# Patient Record
Sex: Male | Born: 1980 | Race: White | Hispanic: No | Marital: Married | Smoking: Never smoker
Health system: Southern US, Community
[De-identification: ages and names within clinical notes are randomized; demographics above are authoritative.]

---

## 2014-09-27 ENCOUNTER — Other Ambulatory Visit: Payer: Self-pay | Admitting: Adult Health

## 2014-09-27 ENCOUNTER — Ambulatory Visit (INDEPENDENT_AMBULATORY_CARE_PROVIDER_SITE_OTHER): Payer: Self-pay

## 2014-09-27 DIAGNOSIS — S6992XA Unspecified injury of left wrist, hand and finger(s), initial encounter: Secondary | ICD-10-CM

## 2014-09-27 DIAGNOSIS — S62639A Displaced fracture of distal phalanx of unspecified finger, initial encounter for closed fracture: Secondary | ICD-10-CM

## 2014-09-30 ENCOUNTER — Encounter: Payer: Self-pay | Admitting: Sports Medicine

## 2014-09-30 ENCOUNTER — Ambulatory Visit (INDEPENDENT_AMBULATORY_CARE_PROVIDER_SITE_OTHER): Payer: Worker's Compensation | Admitting: Sports Medicine

## 2014-09-30 VITALS — BP 134/81 | HR 81 | Ht 71.0 in | Wt 235.0 lb

## 2014-09-30 DIAGNOSIS — M20012 Mallet finger of left finger(s): Secondary | ICD-10-CM

## 2014-09-30 DIAGNOSIS — IMO0001 Reserved for inherently not codable concepts without codable children: Secondary | ICD-10-CM | POA: Insufficient documentation

## 2014-09-30 NOTE — Progress Notes (Signed)
   Subjective:    I'm seeing this patient as a consultation for:  Laurance FlattenKaty Bess, NP  CC:  "broken finger"  HPI:  Patient presents with c/o inability to extend distal interphalangeal joint since he jammed his finger against equipment at work 4 days ago. After the injury the patient did not exhibit pain or swelling, but was unable to move his finger. On the day of injury he presented to occupational health where an XR left Hand showed an avulsion fracture along the proximal most aspect of the third distal phalanx dorsally. He has been wearing a malleable finger splint to keep the phalang in extension since that time. He has not had much pain but takes ibuprofen when he needs it.   Past medical history, Surgical history, Family history not pertinant except as noted below, Social history, Allergies, and medications have been entered into the medical record, reviewed, and no changes needed.   Review of Systems: No headache, visual changes, nausea, vomiting, diarrhea, constipation, dizziness, abdominal pain, skin rash, fevers, chills, night sweats, weight loss, swollen lymph nodes, body aches, joint swelling, muscle aches, chest pain, shortness of breath, mood changes, visual or auditory hallucinations.   Objective:   General: Well Developed, well nourished, and in no acute distress.  Neuro/Psych: Alert and oriented x3, extra-ocular muscles intact, able to move all 4 extremities, sensation grossly intact. Skin: Warm and dry, no rashes noted.  Respiratory: Not using accessory muscles, speaking in full sentences, trachea midline.  Cardiovascular: Pulses palpable, no extremity edema. Abdomen: Does not appear distended. MSK: Left third DIP mildly tender to palpation, there is mild ecchymoses present around the joint. Sensation to fine touch is intact. ROM testing of the joint deferred so as not interrupt healing.    Impression and Recommendations:   This case required medical decision making of moderate  complexity.  # Distal Interphalangeal Joint Avulsion Fracture - Patient's symptoms and imaging consistent with complete osseous avulsion. - Phalange placed in 5.5 size Staxx splint to maintain extension. Plan to continue splinting for 8 weeks. 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 - Patient may continue ibuprofen as needed for pain.   Follow up in 4 weeks or sooner as needed

## 2014-09-30 NOTE — Assessment & Plan Note (Signed)
There is a bony mallet deformity. We are going to do extension splinting for 8 weeks. Return in 4 weeks, for a repeat x-ray.  I billed a fracture code for this encounter, all subsequent visits will be post-op checks in the global period.

## 2014-10-05 ENCOUNTER — Institutional Professional Consult (permissible substitution): Payer: Self-pay | Admitting: Sports Medicine

## 2014-10-28 ENCOUNTER — Encounter: Payer: Self-pay | Admitting: Sports Medicine

## 2014-10-28 ENCOUNTER — Ambulatory Visit (INDEPENDENT_AMBULATORY_CARE_PROVIDER_SITE_OTHER): Payer: Worker's Compensation

## 2014-10-28 ENCOUNTER — Ambulatory Visit (INDEPENDENT_AMBULATORY_CARE_PROVIDER_SITE_OTHER): Payer: Worker's Compensation | Admitting: Sports Medicine

## 2014-10-28 VITALS — BP 125/80 | HR 84 | Ht 71.0 in | Wt 231.0 lb

## 2014-10-28 DIAGNOSIS — M20012 Mallet finger of left finger(s): Secondary | ICD-10-CM

## 2014-10-28 DIAGNOSIS — IMO0001 Reserved for inherently not codable concepts without codable children: Secondary | ICD-10-CM

## 2014-10-28 NOTE — Progress Notes (Signed)
  Subjective:  This pleasant 34 year old male is 4 weeks post bony mallet deformity of the left third finger, doing extremely well in a extension splint.  Objective: General: Well-developed, well-nourished, and in no acute distress. Left third finger: Splint is removed and he is able to maintain active and full extension with resistance. No tenderness to palpation of the dorsum of the distal interphalangeal joint.  X-rays reviewed and show healing of the bony mallet deformity.  Assessment/plan:

## 2014-10-28 NOTE — Assessment & Plan Note (Signed)
Overall doing extremely well, able to maintain full extension both passive and active. X-rays look good, there is evidence of healing. I'm going to have him return to full duty however he will be extremely careful to continue his extension splint, and avoid any trauma to the finger. Return in one month, we will likely discontinue the extension splint and start working on range of motion.

## 2014-11-25 ENCOUNTER — Encounter: Payer: Self-pay | Admitting: Sports Medicine

## 2014-11-25 ENCOUNTER — Ambulatory Visit (INDEPENDENT_AMBULATORY_CARE_PROVIDER_SITE_OTHER): Payer: Worker's Compensation | Admitting: Sports Medicine

## 2014-11-25 VITALS — BP 124/78 | HR 69 | Ht 71.0 in | Wt 232.0 lb

## 2014-11-25 DIAGNOSIS — IMO0001 Reserved for inherently not codable concepts without codable children: Secondary | ICD-10-CM

## 2014-11-25 DIAGNOSIS — M20012 Mallet finger of left finger(s): Secondary | ICD-10-CM

## 2014-11-25 NOTE — Progress Notes (Signed)
  Subjective:  This pleasant 34 year old male is 8 weeks post bony mallet deformity of the left third finger, doing extremely well in a extension splint.  Objective: General: Well-developed, well-nourished, and in no acute distress. Left third finger: Splint is removed and he is able to maintain active and full extension with resistance. No tenderness to palpation of the dorsum of the distal interphalangeal joint. Again, able to maintain full extension with resistance. He does have some degree of flexion but is limited as expected.  Assessment/plan:

## 2014-11-25 NOTE — Assessment & Plan Note (Signed)
Doing extremely well after bony mallet fracture, with full extension, and healing on the last x-ray. At this point we will discontinue the extension splint, and work on gentle range of motion. Return to see me in 4 weeks for a final recheck before discharge.

## 2014-12-23 ENCOUNTER — Ambulatory Visit: Payer: Self-pay | Admitting: Sports Medicine

## 2014-12-24 ENCOUNTER — Ambulatory Visit (INDEPENDENT_AMBULATORY_CARE_PROVIDER_SITE_OTHER): Payer: Worker's Compensation | Admitting: Sports Medicine

## 2014-12-24 ENCOUNTER — Encounter: Payer: Self-pay | Admitting: Sports Medicine

## 2014-12-24 VITALS — BP 123/74 | HR 98 | Ht 71.0 in | Wt 241.0 lb

## 2014-12-24 DIAGNOSIS — M20012 Mallet finger of left finger(s): Secondary | ICD-10-CM | POA: Diagnosis not present

## 2014-12-24 DIAGNOSIS — IMO0001 Reserved for inherently not codable concepts without codable children: Secondary | ICD-10-CM

## 2014-12-24 NOTE — Assessment & Plan Note (Signed)
Still has approximately 2-3 of extension lag but excellent strength to flexion and extension. He is functional at work and happy with the results. He can return as needed.

## 2014-12-24 NOTE — Progress Notes (Signed)
  Subjective:    CC: Follow-up mallet finger  HPI: Seth Morrow returns, I saw him with a mallet deformity of his left third finger, he was in an extension splint for 8 weeks, and returned with excellent resolution of symptoms, and full strength. Today he comes back for a final recheck, he still has approximately 2-3 of extension lag but excellent strength to extension and flexion at the distal interphalangeal joint. He is happy with results so far, functional at work, and does not desire any further visits.  Past medical history, Surgical history, Family history not pertinant except as noted below, Social history, Allergies, and medications have been entered into the medical record, reviewed, and no changes needed.   Review of Systems: No fevers, chills, night sweats, weight loss, chest pain, or shortness of breath.   Objective:    General: Well Developed, well nourished, and in no acute distress.  Neuro: Alert and oriented x3, extra-ocular muscles intact, sensation grossly intact.  HEENT: Normocephalic, atraumatic, pupils equal round reactive to light, neck supple, no masses, no lymphadenopathy, thyroid nonpalpable.  Skin: Warm and dry, no rashes. Cardiac: Regular rate and rhythm, no murmurs rubs or gallops, no lower extremity edema.  Respiratory: Clear to auscultation bilaterally. Not using accessory muscles, speaking in full sentences. Left hand: Middle finger with approximately 2-3 of extension lag at the DIP however excellent strength to resisted extension and flexion with stable collaterals and neurovascularly intact distally.  Impression and Recommendations:

## 2015-05-26 IMAGING — DX DG HAND COMPLETE 3+V*L*
3 series · 3 of 3 positions shown · non-contrast
Comparison: None.

CLINICAL DATA: Jamming injury left third DIP joint are earlier
today

EXAM:
LEFT HAND - COMPLETE 3+ VIEW

[hand ap]
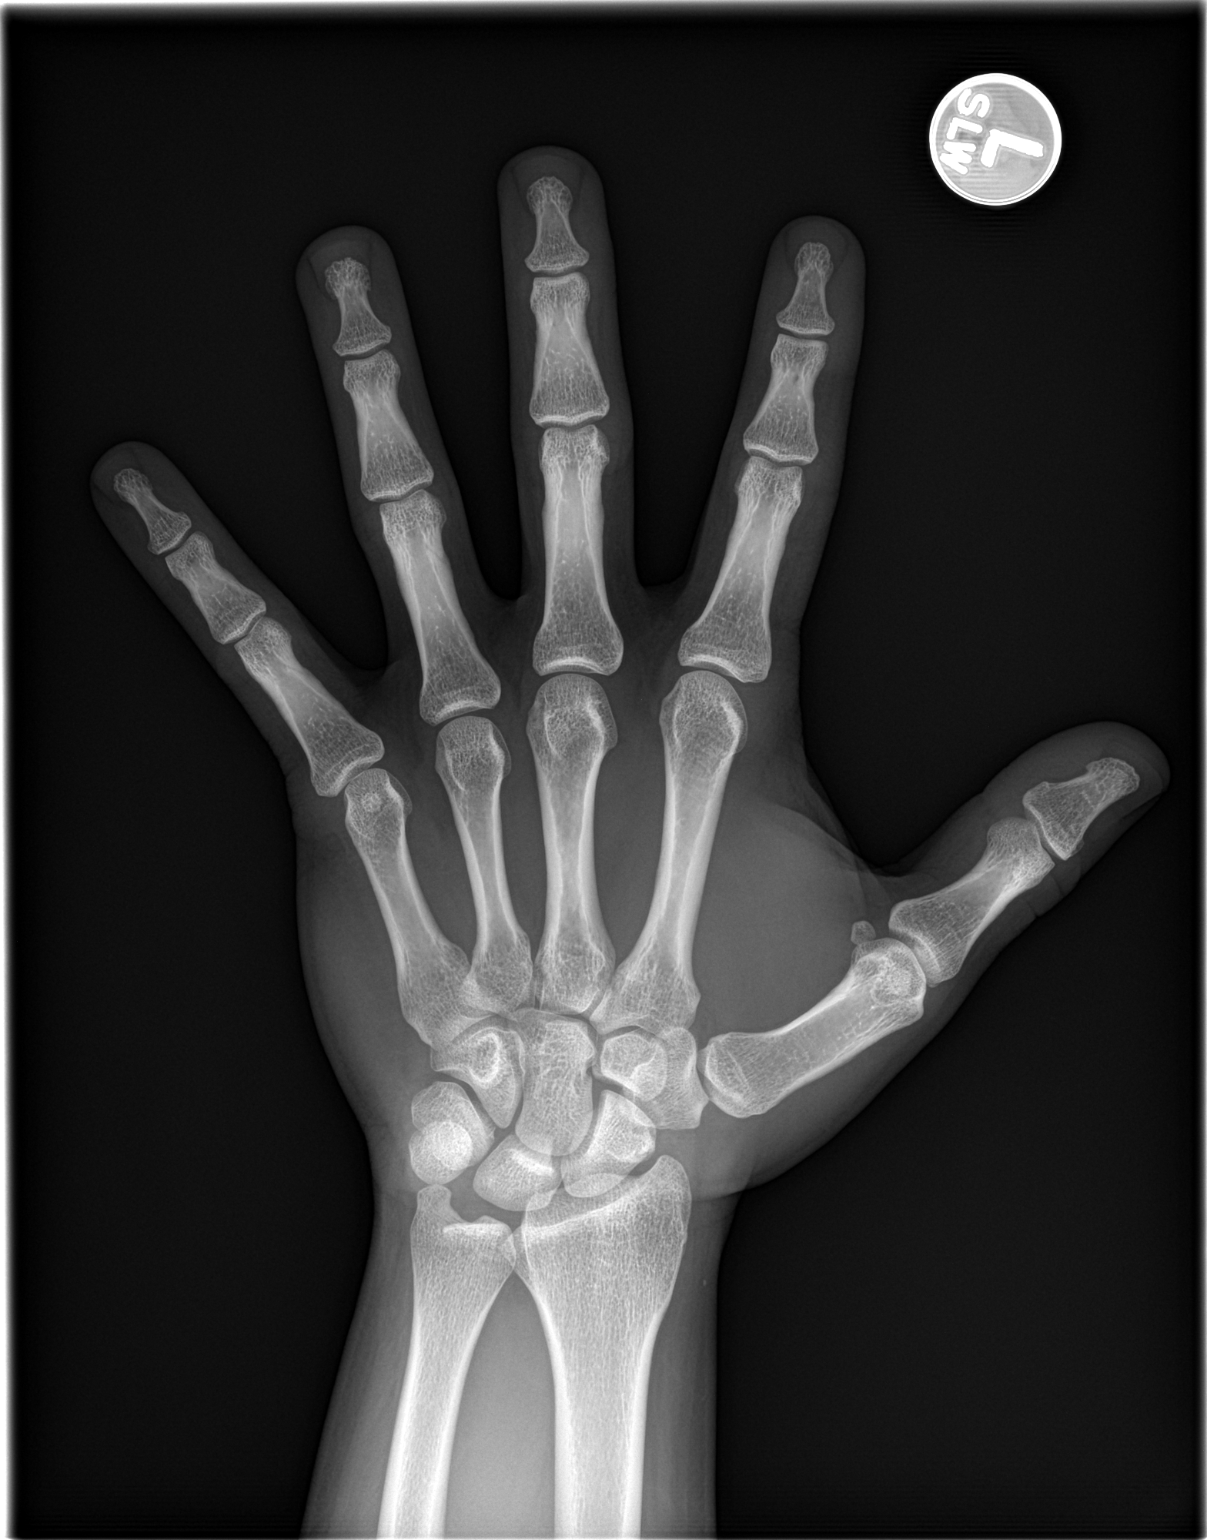

[hand obl]
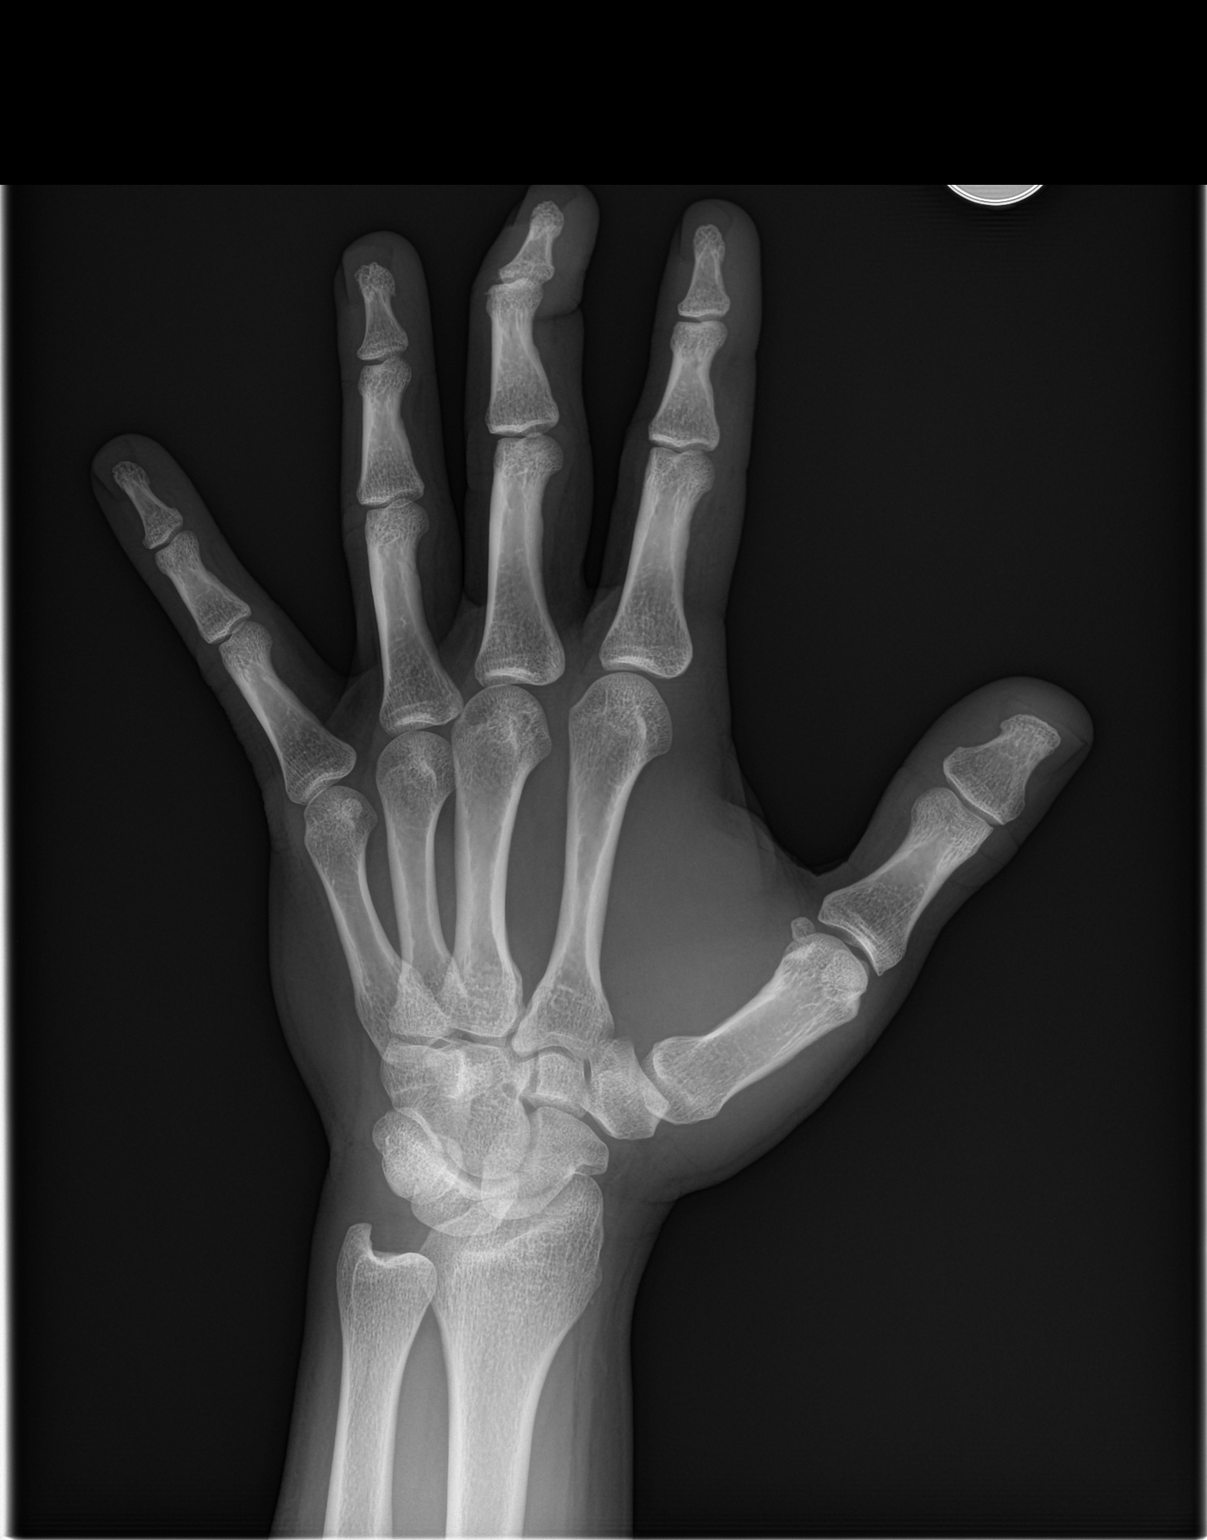

[hand lat]
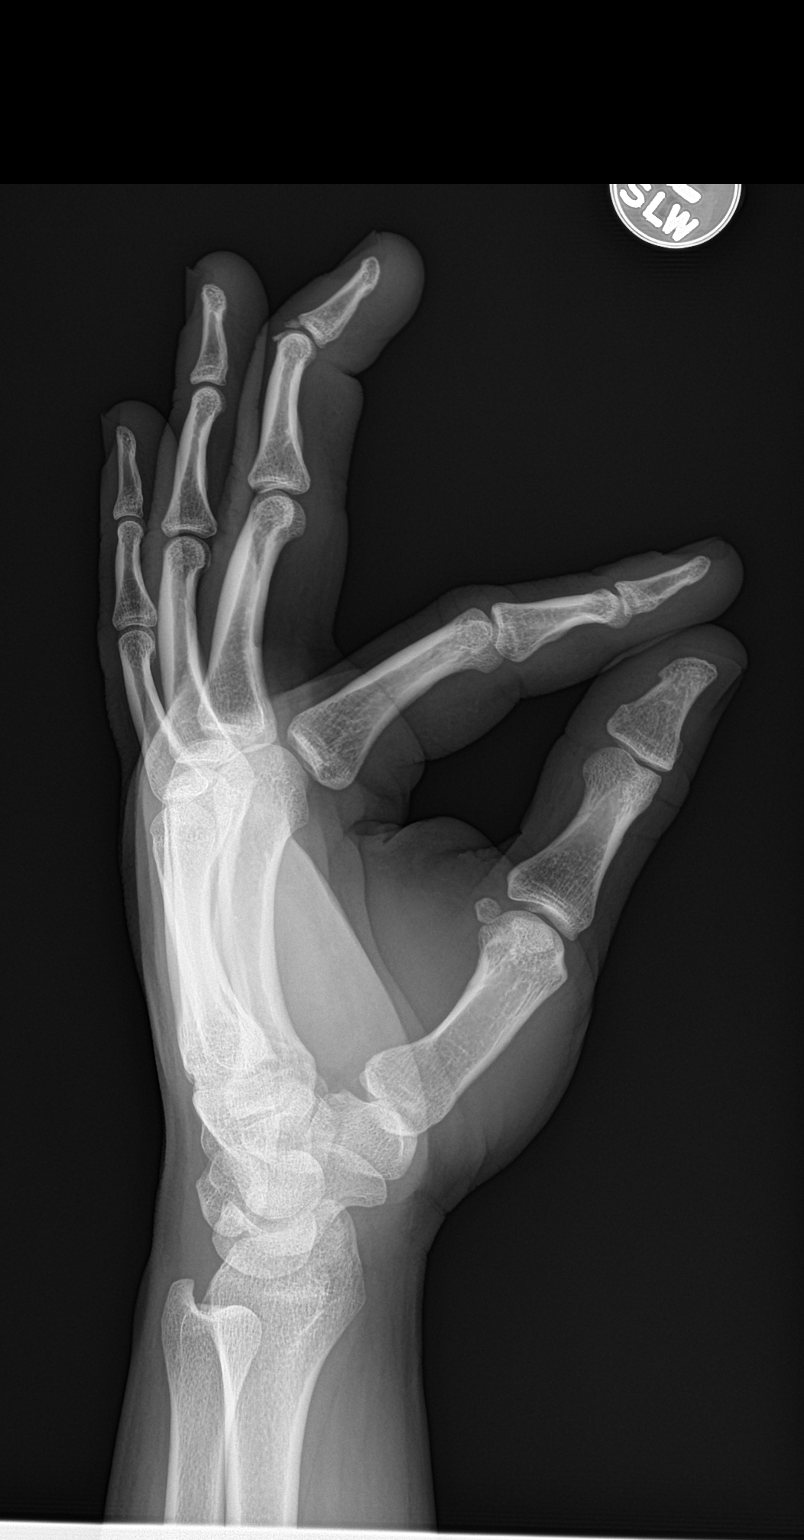

[3 of 3 positions shown; findings below may reference images not displayed]

FINDINGS: Frontal, oblique, and lateral views were obtained. There is an
avulsion type injury arising from the dorsal aspect of the proximal
most aspect of the third distal phalanx. No other apparent fracture.
No dislocation. Joint spaces appear intact. No erosive change.
IMPRESSION: Avulsion fracture along the proximal most aspect of the third distal
phalanx dorsally. A small ossified area is located in the dorsal DIP
joint region. No other evidence of fracture. No dislocation. Joint
spaces appear intact.

## 2015-06-26 IMAGING — CR DG FINGER MIDDLE 2+V*L*
3 series · 3 of 3 positions shown · non-contrast
Comparison: Left hand films of 09/27/2014

CLINICAL DATA: Mallet finger, re-evaluate

EXAM:
LEFT MIDDLE FINGER 2+V

[finger ap]
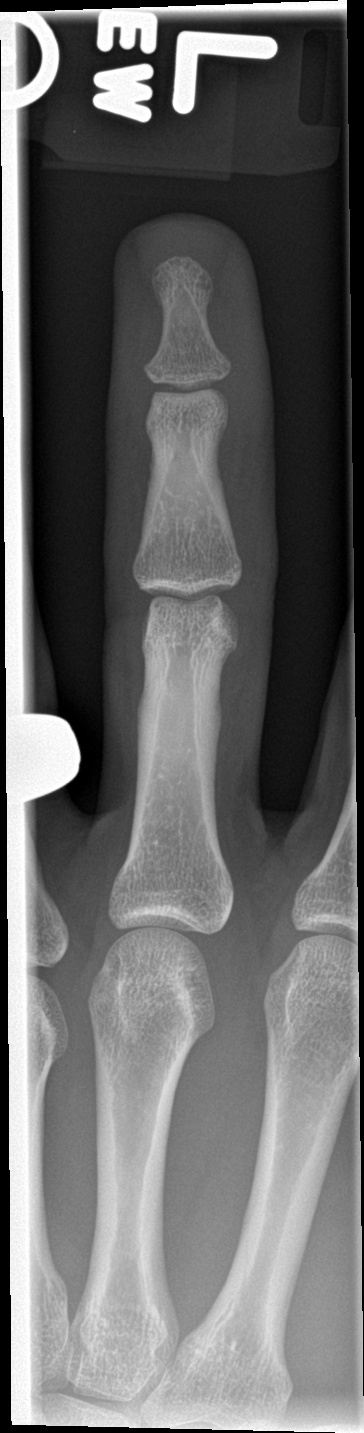

[finger obl]
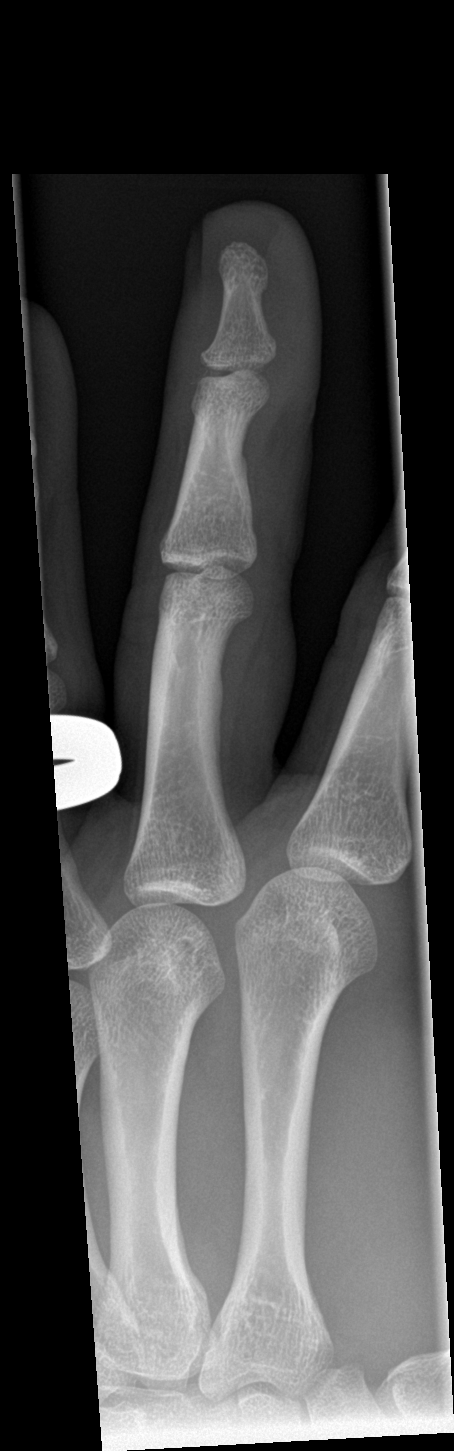

[finger lat]
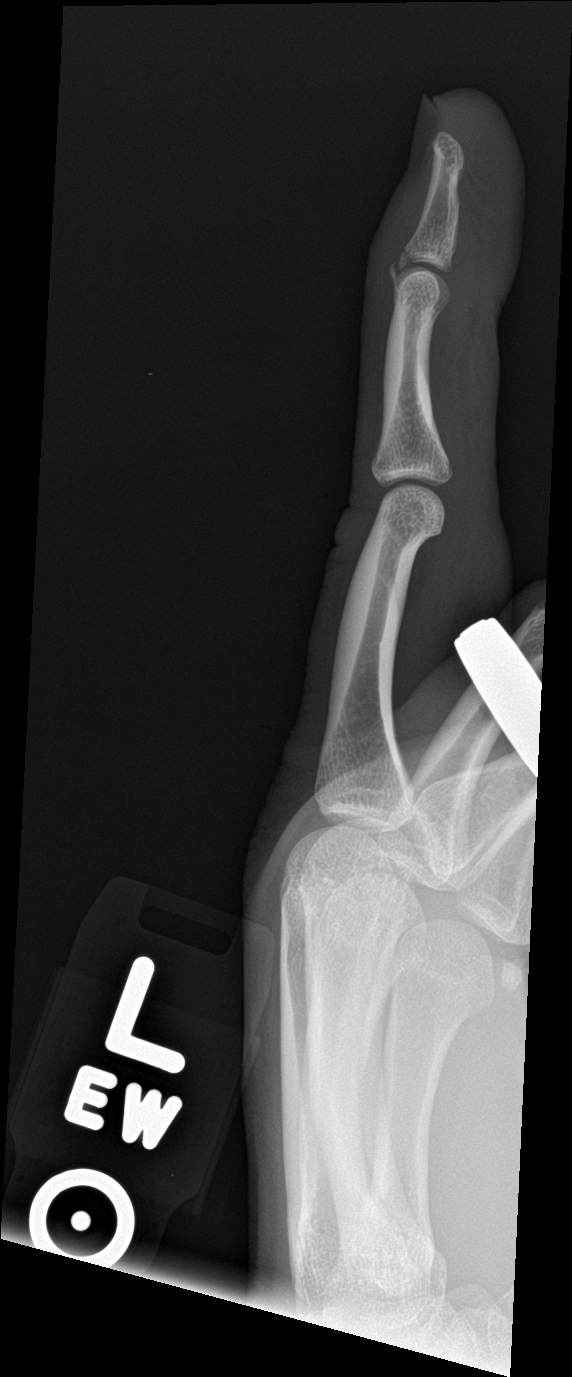

[3 of 3 positions shown; findings below may reference images not displayed]

FINDINGS: The small avulsion fracture fragment from the base of the distal
phalanx of the left third digit on the dorsal aspect is unchanged in
position. Also, [employer] density along the dorsal aspect of the
distal portion of the middle phalanx is unchanged. There is soft
tissue swelling present over the DIP joint. Joint spaces are
unremarkable. Alignment is normal
IMPRESSION: No change in small avulsion fracture fragments on the dorsal aspect
of the left third DIP joint as noted above.
